# Patient Record
Sex: Female | Born: 2000 | Race: Black or African American | Hispanic: No | Marital: Single
Health system: Midwestern US, Community
[De-identification: ages and names within clinical notes are randomized; demographics above are authoritative.]

---
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
## 2019-01-05 ENCOUNTER — Encounter (HOSPITAL_COMMUNITY): Payer: Self-pay | Admitting: Emergency Medicine

## 2019-01-05 ENCOUNTER — Emergency Department (HOSPITAL_COMMUNITY)
Admission: EM | Admit: 2019-01-05 | Discharge: 2019-01-06 | Disposition: A | Discharge status: Home / Self Care | Payer: Federal, State, Local not specified - PPO | Attending: Emergency Medicine | Admitting: Emergency Medicine

## 2019-01-05 ENCOUNTER — Other Ambulatory Visit: Payer: Self-pay

## 2019-01-05 ENCOUNTER — Emergency Department (HOSPITAL_COMMUNITY): Payer: Federal, State, Local not specified - PPO

## 2019-01-05 DIAGNOSIS — W1830XA Fall on same level, unspecified, initial encounter: Secondary | ICD-10-CM | POA: Diagnosis not present

## 2019-01-05 DIAGNOSIS — Y999 Unspecified external cause status: Secondary | ICD-10-CM | POA: Insufficient documentation

## 2019-01-05 DIAGNOSIS — Y9368 Activity, volleyball (beach) (court): Secondary | ICD-10-CM | POA: Insufficient documentation

## 2019-01-05 DIAGNOSIS — Y92318 Other athletic court as the place of occurrence of the external cause: Secondary | ICD-10-CM | POA: Insufficient documentation

## 2019-01-05 DIAGNOSIS — W19XXXA Unspecified fall, initial encounter: Secondary | ICD-10-CM

## 2019-01-05 DIAGNOSIS — S86811A Strain of other muscle(s) and tendon(s) at lower leg level, right leg, initial encounter: Secondary | ICD-10-CM

## 2019-01-05 DIAGNOSIS — S8991XA Unspecified injury of right lower leg, initial encounter: Secondary | ICD-10-CM | POA: Diagnosis present

## 2019-01-05 LAB — I-STAT CHEM 8, ED
BUN: 11 mg/dL (ref 6–20)
Calcium, Ion: 1.21 mmol/L (ref 1.15–1.40)
Chloride: 105 mmol/L (ref 98–111)
Creatinine, Ser: 0.8 mg/dL (ref 0.44–1.00)
Glucose, Bld: 98 mg/dL (ref 70–99)
HCT: 43 % (ref 36.0–46.0)
Hemoglobin: 14.6 g/dL (ref 12.0–15.0)
Potassium: 3.5 mmol/L (ref 3.5–5.1)
Sodium: 140 mmol/L (ref 135–145)
TCO2: 20 mmol/L — ABNORMAL LOW (ref 22–32)

## 2019-01-05 LAB — I-STAT BETA HCG BLOOD, ED (MC, WL, AP ONLY): I-stat hCG, quantitative: <5 m[IU]/mL (ref <5)

## 2019-01-05 MED ORDER — FENTANYL CITRATE (PF) 100 MCG/2ML IJ SOLN
50.0000 ug | Freq: Once | INTRAMUSCULAR | Status: AC
  Administered 2019-01-05: 19:00:00 50 ug via INTRAVENOUS
  Administered 2019-01-05: 16:00:00 100 ug via INTRAVENOUS
  Filled 2019-01-05: qty 2

## 2019-01-05 MED ORDER — OXYCODONE-ACETAMINOPHEN 5-325 MG PO TABS
1.0000 | ORAL_TABLET | Freq: Four times a day (QID) | ORAL | Status: DC | PRN
  Administered 2019-01-06: 1 via ORAL
  Filled 2019-01-05: qty 1

## 2019-01-05 MED ORDER — HYDROMORPHONE HCL 1 MG/ML IJ SOLN: 0.5000 mg | Freq: Once | INTRAMUSCULAR | Status: DC

## 2019-01-05 MED ORDER — OXYCODONE-ACETAMINOPHEN 5-325 MG PO TABS: 1.0000 | ORAL_TABLET | ORAL | 0 refills | Status: AC | PRN

## 2019-01-05 MED ORDER — HYDROXYZINE HCL 10 MG PO TABS
10.0000 mg | ORAL_TABLET | Freq: Once | ORAL | Status: AC
  Administered 2019-01-05: 17:00:00 10 mg via ORAL
  Filled 2019-01-05: qty 1

## 2019-01-05 MED ORDER — IBUPROFEN 400 MG PO TABS: 400.0000 mg | ORAL_TABLET | ORAL | Status: DC | PRN

## 2019-01-05 MED ORDER — FENTANYL CITRATE (PF) 100 MCG/2ML IJ SOLN: 50.0000 ug | Freq: Once | INTRAMUSCULAR | Status: DC

## 2019-01-05 MED ORDER — CYCLOBENZAPRINE HCL 10 MG PO TABS
10.0000 mg | ORAL_TABLET | Freq: Once | ORAL | Status: AC
  Administered 2019-01-05: 23:00:00 10 mg via ORAL
  Filled 2019-01-05: qty 1

## 2019-01-05 MED ORDER — HYDROMORPHONE HCL 1 MG/ML IJ SOLN
0.5000 mg | Freq: Once | INTRAMUSCULAR | Status: AC
  Administered 2019-01-05: 22:00:00 0.5 mg via INTRAVENOUS
  Filled 2019-01-05: qty 1

## 2019-01-05 MED ORDER — FENTANYL CITRATE (PF) 100 MCG/2ML IJ SOLN
50.0000 ug | Freq: Once | INTRAMUSCULAR | Status: AC
  Administered 2019-01-05: 50 ug via INTRAVENOUS
  Filled 2019-01-05: qty 2

## 2019-01-05 MED ORDER — HYDROMORPHONE HCL 1 MG/ML IJ SOLN
0.5000 mg | Freq: Once | INTRAMUSCULAR | Status: AC
  Administered 2019-01-05: 0.5 mg via INTRAVENOUS
  Filled 2019-01-05: qty 1

## 2019-01-05 MED ORDER — CYCLOBENZAPRINE HCL 10 MG PO TABS
5.0000 mg | ORAL_TABLET | Freq: Once | ORAL | Status: AC
  Administered 2019-01-05: 16:00:00 5 mg via ORAL
  Filled 2019-01-05: qty 1

## 2019-01-05 MED ORDER — CYCLOBENZAPRINE HCL 10 MG PO TABS: 10.0000 mg | ORAL_TABLET | Freq: Two times a day (BID) | ORAL | 0 refills | Status: AC | PRN

## 2019-01-05 MED ORDER — OXYCODONE-ACETAMINOPHEN 5-325 MG PO TABS
1.0000 | ORAL_TABLET | Freq: Once | ORAL | Status: AC
  Administered 2019-01-05: 1 via ORAL
  Filled 2019-01-05: qty 1

## 2019-01-05 MED ORDER — FENTANYL CITRATE (PF) 100 MCG/2ML IJ SOLN
INTRAMUSCULAR | Status: AC
  Administered 2019-01-05: 16:00:00 100 ug via INTRAVENOUS
  Filled 2019-01-05: qty 2

## 2019-01-05 NOTE — ED Triage Notes (Signed)
Pt here after injuring her right leg/knee during volleyball practice.  Pt states "I took a misstep".  Pt BIB volleyball trainer.

## 2019-01-05 NOTE — ED Provider Notes (Signed)
[facility] Provider Note   CSN: [id number] Arrival date & time: 01/05/19  1542     History   Chief Complaint Chief Complaint  Patient presents with  . Knee Injury    HPI Sophia Edmanrianna Borneman is a 18 y.o. female.     The history is provided by the patient.  Knee Pain Location:  Knee Injury: yes   Mechanism of injury: fall   Fall:    Fall occurred:  [place]   Point of impact:  Knees   Entrapped after fall: no   Knee location:  R knee Pain details:    Quality:  Sharp   Radiates to:  Does not radiate   Severity:  Severe   Onset quality:  Sudden   Duration: Arrive from scene.   Timing:  Constant   Progression:  Worsening Chronicity:  New Foreign body present:  No foreign bodies Tetanus status:  Up to date Ineffective treatments:  Immobilization Associated symptoms: no fever and no muscle weakness     Patient has a history of anxiety, depression, and patellar tendonitis.  She presents for a fall while at volleyball practice.  She states she fell on a basketball court floor while running and landed on her right knee.  She heard a pop at the time.  Her tetanus is up-to-date.  History reviewed. No pertinent past medical history.  There are no active problems to display for this patient.  OB History   No obstetric history on file.      Home Medications    Prior to Admission medications   Medication Sig Start Date End Date Taking? Authorizing Provider  cetirizine (ZYRTEC) 10 MG tablet Take 10 mg by mouth daily.   Yes [provider]  hydrOXYzine (ATARAX/VISTARIL) 10 MG tablet Take 10 mg by mouth every 4 (four) hours as needed for anxiety.   Yes [provider]  magnesium chloride (SLOW-MAG) 64 MG TBEC SR tablet Take 1 tablet by mouth daily.   Yes [provider]  norethindrone-ethinyl estradiol (LOESTRIN FE) 1-20 MG-MCG tablet Take 1 tablet by mouth daily.   Yes [provider]  sertraline  (ZOLOFT) 50 MG tablet Take 50 mg by mouth daily at 12 noon.   Yes [provider]  cyclobenzaprine (FLEXERIL) 10 MG tablet Take 1 tablet (10 mg total) by mouth 2 (two) times daily as needed for up to 30 doses for muscle spasms. 01/05/19   Chester HolsteinVaithi, Kongmeng Santoro, MD  oxyCODONE-acetaminophen (PERCOCET/ROXICET) 5-325 MG tablet Take 1 tablet by mouth every 4 (four) hours as needed for severe pain. 01/05/19   Tegeler, Canary Brimhristopher J, MD    Family History History reviewed. No pertinent family history.  Social History Social History   Tobacco Use  . Smoking status: Not on file  Substance Use Topics  . Alcohol use: Not on file  . Drug use: Not on file     Allergies   Patient has no known allergies.   Review of Systems Review of Systems  Constitutional: Negative for fever.  Respiratory: Negative for shortness of breath.   Cardiovascular: Negative for chest pain.  Gastrointestinal: Negative for abdominal pain, nausea and vomiting.  Musculoskeletal: Positive for arthralgias, gait problem and joint swelling.  Skin: Negative for rash.  Neurological: Negative for dizziness.  Psychiatric/Behavioral: The patient is nervous/anxious.   All other systems reviewed and are negative.    Physical Exam Updated Vital Signs BP 131/72   Pulse 70   Temp 98.8 F (37.1 C) (  Oral)   Resp 13   LMP 12/22/2018   SpO2 99%   Physical Exam Vitals signs and nursing note reviewed.  Constitutional:      General: She is in acute distress.     Appearance: She is well-developed.     Comments: Patient is very distressed and in acute pain  HENT:     Head: Normocephalic and atraumatic.  Eyes:     Conjunctiva/sclera: Conjunctivae normal.  Neck:     Musculoskeletal: Neck supple.  Cardiovascular:     Rate and Rhythm: Regular rhythm. Tachycardia present.     Pulses: Normal pulses.     Heart sounds: No murmur.     Comments: 2+ DP and PT pulses bilaterally Pulmonary:     Effort: Pulmonary effort is  normal. No respiratory distress.     Breath sounds: Normal breath sounds.     Comments: Tachypnea is due to to anxiety and pain Abdominal:     General: There is no distension.     Palpations: Abdomen is soft.     Tenderness: There is no abdominal tenderness. There is no guarding or rebound.  Musculoskeletal:     Comments: Swelling to the right lower extremity around the knee, no obvious deformity, extreme pain over the right lateral aspect of the knee  Skin:    General: Skin is warm and dry.     Comments: Small abrasion to the right lower leg  Neurological:     Mental Status: She is alert.     Comments: Right lower extremity neurovascularly intact.      ED Treatments / Results  Labs (all labs ordered are listed, but only abnormal results are displayed) Labs Reviewed  I-STAT CHEM 8, ED - Abnormal; Notable for the following components:      Result Value   TCO2 20 (*)    All other components within normal limits  I-STAT BETA HCG BLOOD, ED ([facility], [facility], [facility] ONLY)    EKG None  Radiology Dg Knee 1-2 Views Right  Addendum Date: 01/05/2019   ADDENDUM REPORT: 01/05/2019 17:42 ADDENDUM: Patella appears high riding raising possibility of tendon rupture. Electronically Signed   By: Guadlupe Spanish M.D.   On: 01/05/2019 17:42   Result Date: 01/05/2019 CLINICAL DATA:  Fall EXAM: RIGHT KNEE - 1-2 VIEW COMPARISON:  None. FINDINGS: Alignment is anatomic. There is no acute fracture. Limited evaluation for joint effusion due to obliquity of lateral view. Joint spaces are preserved. IMPRESSION: No acute fracture or malalignment. Electronically Signed: By: Guadlupe Spanish M.D. On: 01/05/2019 17:23   Dg Tibia/fibula Right  Result Date: 01/05/2019 CLINICAL DATA:  Right knee pain. EXAM: RIGHT TIBIA AND FIBULA - 2 VIEW COMPARISON:  None. FINDINGS: There is no evidence of fracture or other focal bone lesions. Soft tissues are unremarkable. IMPRESSION: Negative. Electronically Signed   By: Katherine Mantle M.D.   On: 01/05/2019 17:22   Dg Femur, Min 2 Views Right  Result Date: 01/05/2019 CLINICAL DATA:  Acute pain EXAM: RIGHT FEMUR 2 VIEWS COMPARISON:  None. FINDINGS: There is no acute displaced fracture. No dislocation. The positioning of the patella appears somewhat high. This may be secondary to patient positioning. IMPRESSION: 1. No acute displaced fracture or dislocation. 2. The positioning of the patella appears somewhat high. This may be secondary to patient positioning. Correlation with knee radiographs is recommended. Electronically Signed   By: Katherine Mantle M.D.   On: 01/05/2019 17:23    Procedures Procedures (including critical care time)  Medications Ordered in ED Medications  fentaNYL (SUBLIMAZE) injection 50 mcg (has no administration in time range)  cyclobenzaprine (FLEXERIL) tablet 10 mg (has no administration in time range)  hydrOXYzine (ATARAX/VISTARIL) tablet 10 mg (10 mg Oral Given 01/05/19 1723)  cyclobenzaprine (FLEXERIL) tablet 5 mg (5 mg Oral Given 01/05/19 1625)  fentaNYL (SUBLIMAZE) injection 50 mcg (50 mcg Intravenous Given 01/05/19 1722)  fentaNYL (SUBLIMAZE) injection 50 mcg (50 mcg Intravenous Given 01/05/19 1905)  HYDROmorphone (DILAUDID) injection 0.5 mg (0.5 mg Intravenous Given 01/05/19 1958)  oxyCODONE-acetaminophen (PERCOCET/ROXICET) 5-325 MG per tablet 1 tablet (1 tablet Oral Given 01/05/19 2204)  HYDROmorphone (DILAUDID) injection 0.5 mg (0.5 mg Intravenous Given 01/05/19 2205)     Initial Impression / Assessment and Plan / ED Course  I have reviewed the triage vital signs and the nursing notes.  Pertinent labs & imaging results that were available during my care of the patient were reviewed by me and considered in my medical decision making (see chart for details).        Sophia Fletcher is a 18 y.o. female with a past medical history of patellar tendinitis, anxiety, depression presents in acute distress from right knee pain after  fall.  X-rays, labs ordered for differential diagnosis of dislocation, fracture, electrolyte abnormality.  Patient given pain medication, anxiety medication, muscle relaxers.  Labs do not show any acute abnormality.  X-ray showed concern for patellar tendon rupture.  Orthopedics consulted, and Dr. Doreatha Martin recommends knee immobilizer, follow-up next week on Monday or Tuesday, crutches.  Patient is given multiple doses of pain medications while in the ED, pain is controlled.  Pain medications given prior to discharge, all questions answered at length for patient, trainer, mother.  Muscle relaxer prescription given prior to discharge. Care of patient discussed with the supervising attending.  Patient will go home with her parents who are on their way to pick her up.  In the meantime, she is pain controlled in the emergency department.  Final Clinical Impressions(s) / ED Diagnoses   Final diagnoses:  Fall  Rupture of right patellar tendon, initial encounter    ED Discharge Orders         Ordered    oxyCODONE-acetaminophen (PERCOCET/ROXICET) 5-325 MG tablet  Every 4 hours PRN     01/05/19 1842    cyclobenzaprine (FLEXERIL) 10 MG tablet  2 times daily PRN     01/05/19 1842           Julianne Rice, MD 01/05/19 2236    Tegeler, Gwenyth Allegra, MD 01/07/19 0201

## 2019-01-06 DIAGNOSIS — S86811A Strain of other muscle(s) and tendon(s) at lower leg level, right leg, initial encounter: Secondary | ICD-10-CM | POA: Diagnosis not present

## 2019-01-06 NOTE — ED Notes (Signed)
Discharge instructions discussed with pt. Pt verbalized understanding. Pt stable and ambulatory. No signature pad available. 

## 2019-05-18 ENCOUNTER — Ambulatory Visit: Payer: No Typology Code available for payment source | Attending: Family

## 2019-05-18 DIAGNOSIS — Z23 Encounter for immunization: Secondary | ICD-10-CM

## 2019-05-18 NOTE — Progress Notes (Signed)
   Covid-19 Vaccination Clinic  Name:  Sophia Fletcher    MRN: [id number] DOB: 01-31-2001  05/18/2019  Sophia Fletcher was observed post Covid-19 immunization for 15 minutes without incident. She was provided with Vaccine Information Sheet and instruction to access the V-Safe system.   Sophia Fletcher was instructed to call 911 with any severe reactions post vaccine: Marland Kitchen Difficulty breathing  . Swelling of face and throat  . A fast heartbeat  . A bad rash all over body  . Dizziness and weakness   Immunizations Administered    Name Date Dose VIS Date Route   Moderna COVID-19 Vaccine 05/18/2019 12:50 PM 0.5 mL 01/24/2019 Intramuscular   Manufacturer: Moderna   Lot: [id number]   NDC: 18288-337-44

## 2019-06-20 ENCOUNTER — Ambulatory Visit: Payer: No Typology Code available for payment source | Attending: Family

## 2019-06-20 DIAGNOSIS — Z23 Encounter for immunization: Secondary | ICD-10-CM

## 2019-06-20 NOTE — Progress Notes (Signed)
   Covid-19 Vaccination Clinic  Name:  Sophia Fletcher    MRN: [id number] DOB: 01-25-2001  06/20/2019  Ms. Wonder was observed post Covid-19 immunization for 15 minutes without incident. She was provided with Vaccine Information Sheet and instruction to access the V-Safe system.   [name] was instructed to call 911 with any severe reactions post vaccine: Marland Kitchen Difficulty breathing  . Swelling of face and throat  . A fast heartbeat  . A bad rash all over body  . Dizziness and weakness   Immunizations Administered    Name Date Dose VIS Date Route   Moderna COVID-19 Vaccine 06/20/2019 10:15 AM 0.5 mL 01/2019 Intramuscular   Manufacturer: Moderna   Lot: [id number]   NDC: 92426-834-19

## 2020-02-12 ENCOUNTER — Ambulatory Visit: Payer: No Typology Code available for payment source | Attending: Family

## 2020-02-12 DIAGNOSIS — Z23 Encounter for immunization: Secondary | ICD-10-CM

## 2020-06-18 NOTE — Progress Notes (Signed)
   Covid-19 Vaccination Clinic  Name:  Tequilla Cousineau    MRN: [id number] DOB: 18-Apr-2000  06/18/2020  Ms. Buenaventura was observed post Covid-19 immunization for 15 minutes without incident. She was provided with Vaccine Information Sheet and instruction to access the V-Safe system.   Ms. Knapik was instructed to call 911 with any severe reactions post vaccine: Marland Kitchen Difficulty breathing  . Swelling of face and throat  . A fast heartbeat  . A bad rash all over body  . Dizziness and weakness   Immunizations Administered    Name Date Dose VIS Date Route   Moderna Covid-19 Booster Vaccine 02/12/2020 12:00 PM 0.25 mL 12/13/2019 Intramuscular   Manufacturer: Moderna   Lot: [id number]   NDC: 85027-741-28

## 2020-07-07 IMAGING — DX DG FEMUR 2+V*R*
4 series · 4 of 4 positions shown · non-contrast
Comparison: None.

CLINICAL DATA: Acute pain

EXAM:
RIGHT FEMUR 2 VIEWS

[femur ap (1 of 2)]
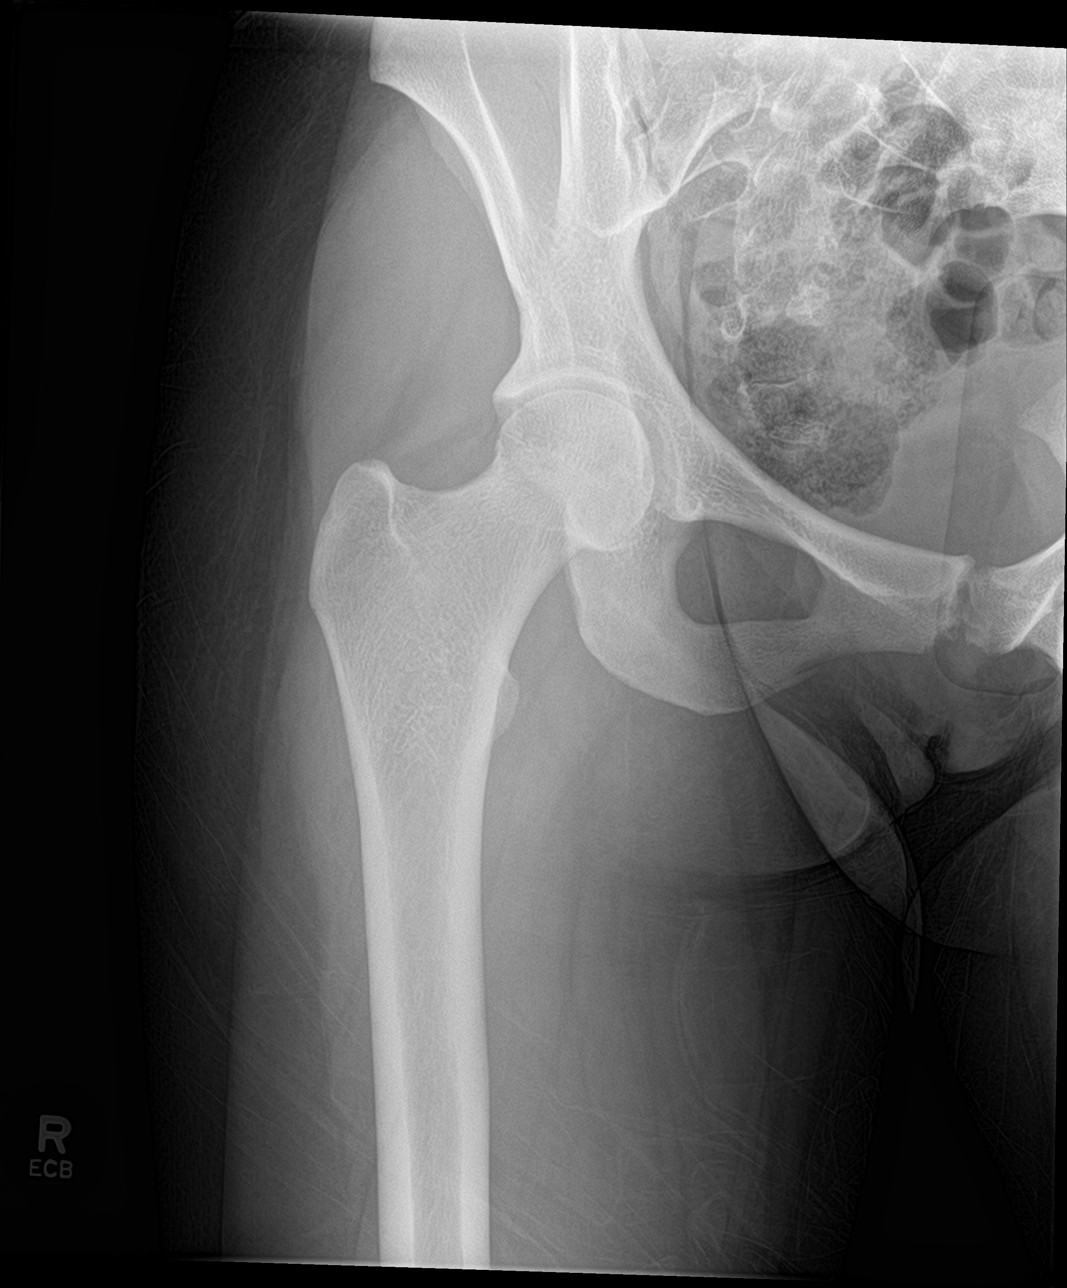

[femur ap (2 of 2)]
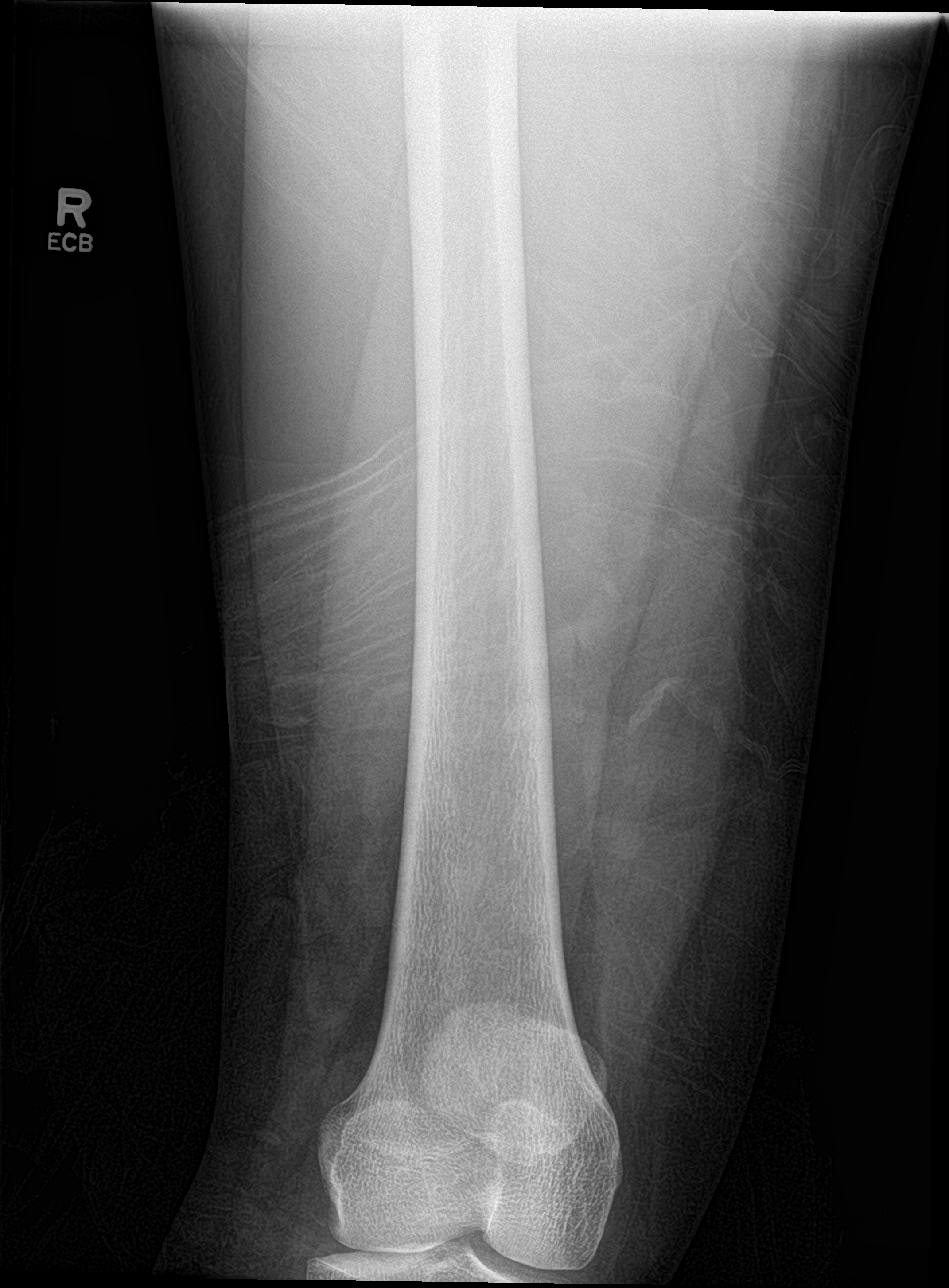

[femur lat (1 of 2)]
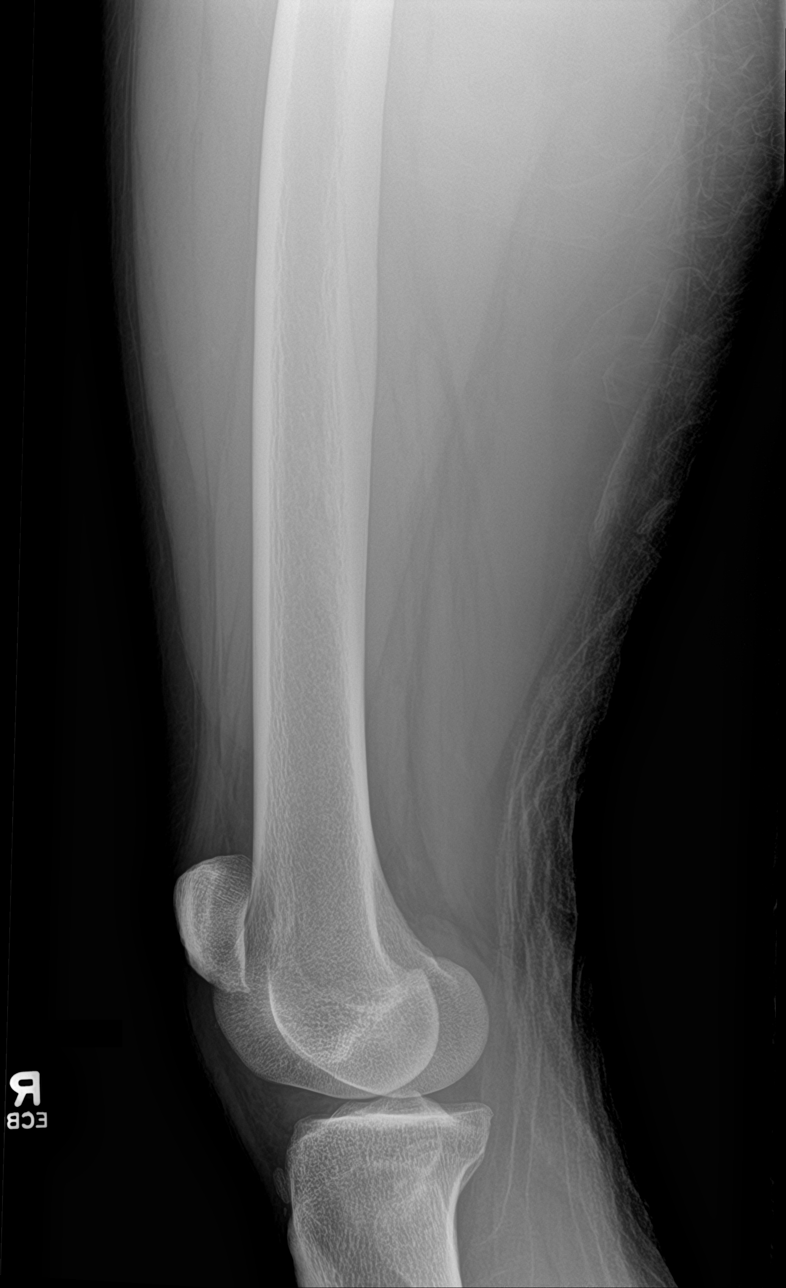

[femur lat (2 of 2)]
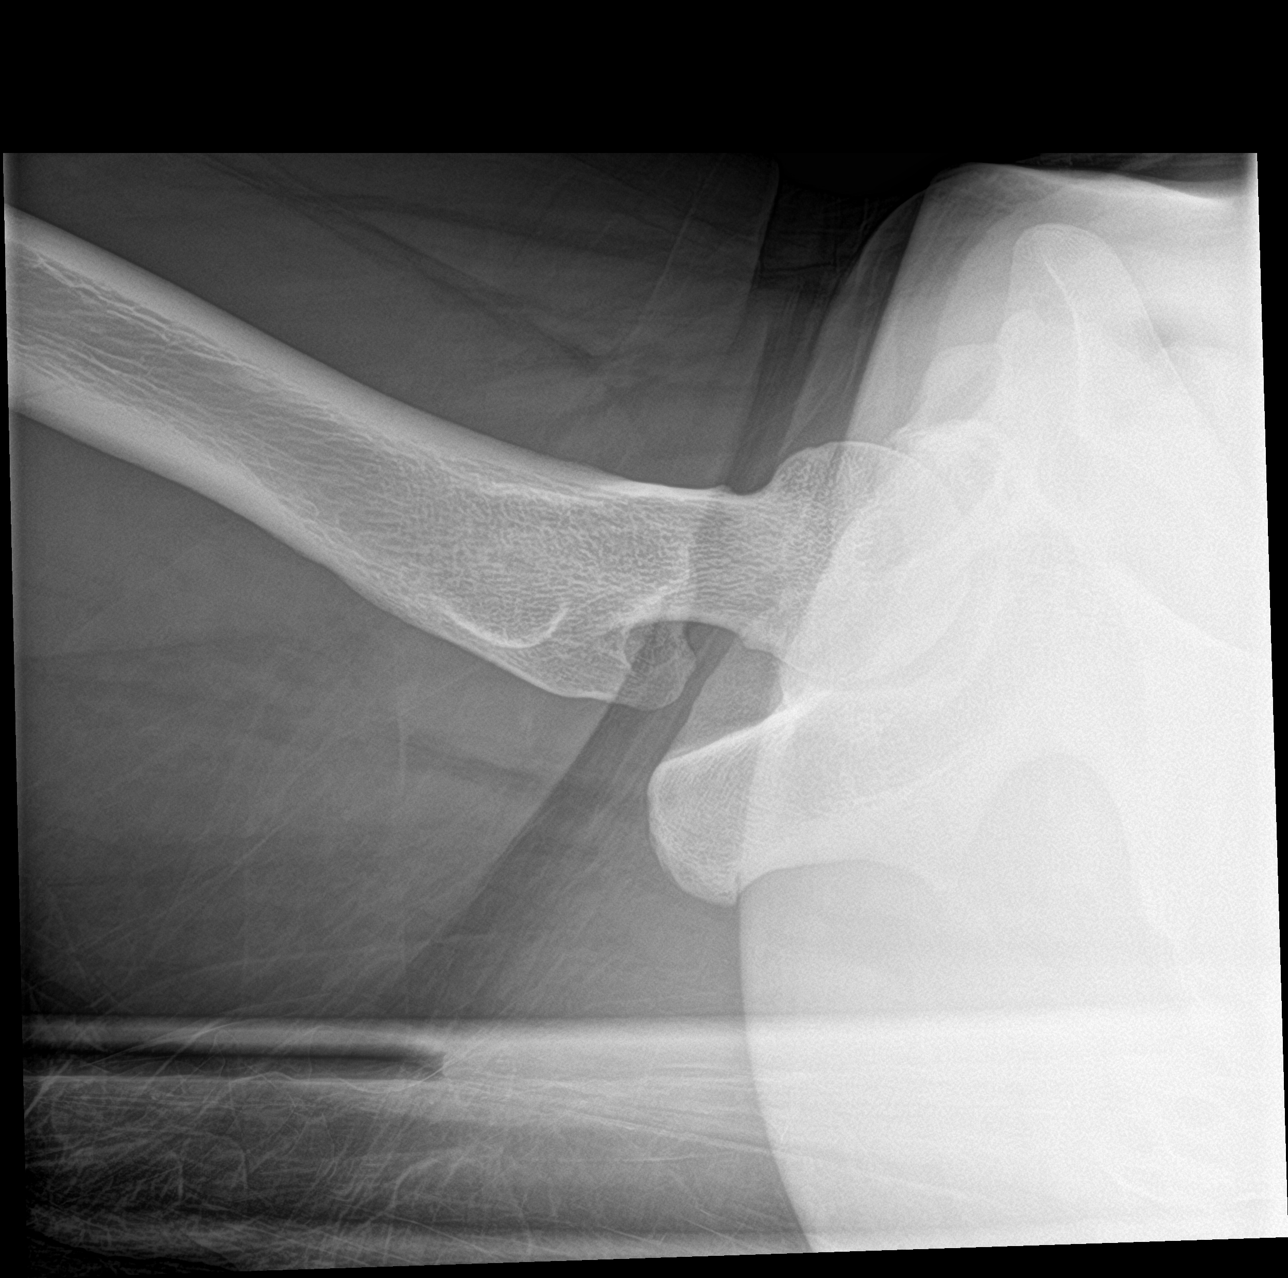

[4 of 4 positions shown; findings below may reference images not displayed]

FINDINGS: There is no acute displaced fracture. No dislocation. The
positioning of the patella appears somewhat high. This may be
secondary to patient positioning.
IMPRESSION: 1. No acute displaced fracture or dislocation.
2. The positioning of the patella appears somewhat high. This may be
secondary to patient positioning. Correlation with knee radiographs
is recommended.
